# Patient Record
Sex: Male | Born: 1960 | Race: White | Hispanic: No | Marital: Married | Smoking: Current some day smoker
Health system: Southern US, Community
[De-identification: ages and names within clinical notes are randomized; demographics above are authoritative.]

## PROBLEM LIST (undated history)

## (undated) DIAGNOSIS — N200 Calculus of kidney: Secondary | ICD-10-CM

## (undated) DIAGNOSIS — E119 Type 2 diabetes mellitus without complications: Secondary | ICD-10-CM

## (undated) DIAGNOSIS — E785 Hyperlipidemia, unspecified: Secondary | ICD-10-CM

## (undated) DIAGNOSIS — I10 Essential (primary) hypertension: Secondary | ICD-10-CM

## (undated) HISTORY — DX: Type 2 diabetes mellitus without complications: E11.9

## (undated) HISTORY — DX: Calculus of kidney: N20.0

## (undated) HISTORY — DX: Hyperlipidemia, unspecified: E78.5

## (undated) HISTORY — PX: KIDNEY STONE SURGERY: SHX686

## (undated) HISTORY — DX: Essential (primary) hypertension: I10

---

## 1987-01-16 HISTORY — PX: PILONIDAL CYST EXCISION: SHX744

## 2009-06-06 ENCOUNTER — Ambulatory Visit: Payer: Self-pay | Admitting: General Surgery

## 2009-06-16 ENCOUNTER — Ambulatory Visit: Payer: Self-pay | Admitting: General Surgery

## 2010-01-15 HISTORY — PX: HERNIA REPAIR: SHX51

## 2010-02-21 ENCOUNTER — Ambulatory Visit: Payer: Self-pay

## 2013-01-15 HISTORY — PX: COLONOSCOPY: SHX174

## 2013-06-01 ENCOUNTER — Ambulatory Visit: Payer: Self-pay | Admitting: Gastroenterology

## 2013-06-02 LAB — PATHOLOGY REPORT

## 2013-06-16 DIAGNOSIS — N200 Calculus of kidney: Secondary | ICD-10-CM | POA: Insufficient documentation

## 2013-06-16 DIAGNOSIS — M109 Gout, unspecified: Secondary | ICD-10-CM | POA: Insufficient documentation

## 2013-06-16 DIAGNOSIS — E119 Type 2 diabetes mellitus without complications: Secondary | ICD-10-CM | POA: Insufficient documentation

## 2013-06-16 DIAGNOSIS — I10 Essential (primary) hypertension: Secondary | ICD-10-CM | POA: Insufficient documentation

## 2013-06-16 DIAGNOSIS — M1990 Unspecified osteoarthritis, unspecified site: Secondary | ICD-10-CM | POA: Insufficient documentation

## 2013-12-14 DIAGNOSIS — R1032 Left lower quadrant pain: Secondary | ICD-10-CM | POA: Insufficient documentation

## 2013-12-25 ENCOUNTER — Ambulatory Visit: Payer: Self-pay | Admitting: Family Medicine

## 2014-01-12 ENCOUNTER — Encounter: Payer: Self-pay | Admitting: *Deleted

## 2014-01-27 ENCOUNTER — Ambulatory Visit (INDEPENDENT_AMBULATORY_CARE_PROVIDER_SITE_OTHER): Payer: 59 | Admitting: General Surgery

## 2014-01-27 ENCOUNTER — Encounter: Payer: Self-pay | Admitting: General Surgery

## 2014-01-27 VITALS — BP 150/70 | HR 76 | Resp 14 | Ht 70.0 in | Wt 227.0 lb

## 2014-01-27 DIAGNOSIS — R1032 Left lower quadrant pain: Secondary | ICD-10-CM

## 2014-01-27 DIAGNOSIS — R1030 Lower abdominal pain, unspecified: Secondary | ICD-10-CM

## 2014-01-27 NOTE — Progress Notes (Signed)
Patient ID: Austin AsaLloyd Glenn Pepin Jr., male   DOB: Jul 16, 1960, 54 y.o.   MRN: [id number]  Chief Complaint  Patient presents with  . Other    left inguinal hernia    HPI Austin AsaLloyd Glenn Broadnax Jr. is a 54 y.o. male here today for a evaluation of a left inguinal pain . Patient had left ing hernia repair in 2012. He had been doing well till few weeks ago. He had a ct scan on 01/22/14.  He states he has been hurting for seven weeks now. HPI  Past Medical History  Diagnosis Date  . Hyperlipidemia   . Diabetes mellitus without complication   . Hypertension   . Kidney stone     Past Surgical History  Procedure Laterality Date  . Pilonidal cyst excision  1989  . Hernia repair Left 2012    inguinal hernia  . Colonoscopy  2015    Dr . Bluford Kaufmannh    History reviewed. No pertinent family history.  Social History History  Substance Use Topics  . Smoking status: Current Every Day Smoker -- 1.00 packs/day for 3 years    Types: Cigarettes  . Smokeless tobacco: Never Used  . Alcohol Use: 0.0 oz/week    0 Not specified per week    No Known Allergies  Current Outpatient Prescriptions  Medication Sig Dispense Refill  . amLODipine (NORVASC) 5 MG tablet Take 5 mg by mouth daily.     . metFORMIN (GLUCOPHAGE) 500 MG tablet 500 mg daily with breakfast.     . simvastatin (ZOCOR) 20 MG tablet Take 20 mg by mouth daily at 6 PM.     . tamsulosin (FLOMAX) 0.4 MG CAPS capsule 0.4 mg daily.     . traMADol (ULTRAM) 50 MG tablet Take 50 mg by mouth every 12 (twelve) hours as needed.      No current facility-administered medications for this visit.    Review of Systems Review of Systems  Constitutional: Negative.   Respiratory: Negative.   Cardiovascular: Negative.     Blood pressure 150/70, pulse 76, resp. rate 14, height 5\' 10"  (1.778 m), weight 227 lb (102.967 kg).  Physical Exam Physical Exam  Constitutional: He is oriented to person, place, and time. He appears well-developed and well-nourished.   Eyes: Conjunctivae are normal. No scleral icterus.  Neck: Neck supple.  Cardiovascular: Normal rate, regular rhythm and normal heart sounds.   Pulmonary/Chest: Effort normal and breath sounds normal.  Abdominal: Soft. Normal appearance and bowel sounds are normal. There is no hepatomegaly. There is no tenderness. No hernia.  Lymphadenopathy:    He has no cervical adenopathy.    He has no axillary adenopathy.  Neurological: He is alert and oriented to person, place, and time.  Skin: Skin is warm and dry.  Careful exam of left inguinal region showed no hernia.  Data Reviewed Ct scan reviewed- no hernia or other abnormality noted in left inguinal area. A very small right ing hernia is reported but it is not noted on exam.  Assessment    No left inguinal hernia felt on today visit. No other findings in inguinal area to account for the pain. He has a large left renal stone for which he is going to have surgery.     Plan   Pt advised fully. Patient to return as needed.       Temple Ewart G 01/28/2014, 6:34 AM

## 2014-01-27 NOTE — Patient Instructions (Addendum)
Patient to return as needed. 

## 2014-01-28 ENCOUNTER — Encounter: Payer: Self-pay | Admitting: General Surgery

## 2015-07-17 ENCOUNTER — Emergency Department
Admission: EM | Admit: 2015-07-17 | Discharge: 2015-07-17 | Disposition: A | Discharge status: Home / Self Care | Payer: 59 | Attending: Emergency Medicine | Admitting: Emergency Medicine

## 2015-07-17 DIAGNOSIS — Y999 Unspecified external cause status: Secondary | ICD-10-CM | POA: Insufficient documentation

## 2015-07-17 DIAGNOSIS — E119 Type 2 diabetes mellitus without complications: Secondary | ICD-10-CM | POA: Diagnosis not present

## 2015-07-17 DIAGNOSIS — T161XXA Foreign body in right ear, initial encounter: Secondary | ICD-10-CM | POA: Diagnosis present

## 2015-07-17 DIAGNOSIS — X58XXXA Exposure to other specified factors, initial encounter: Secondary | ICD-10-CM | POA: Insufficient documentation

## 2015-07-17 DIAGNOSIS — Y939 Activity, unspecified: Secondary | ICD-10-CM | POA: Diagnosis not present

## 2015-07-17 DIAGNOSIS — E785 Hyperlipidemia, unspecified: Secondary | ICD-10-CM | POA: Diagnosis not present

## 2015-07-17 DIAGNOSIS — Z7984 Long term (current) use of oral hypoglycemic drugs: Secondary | ICD-10-CM | POA: Insufficient documentation

## 2015-07-17 DIAGNOSIS — Y929 Unspecified place or not applicable: Secondary | ICD-10-CM | POA: Diagnosis not present

## 2015-07-17 DIAGNOSIS — I10 Essential (primary) hypertension: Secondary | ICD-10-CM | POA: Diagnosis not present

## 2015-07-17 DIAGNOSIS — F17210 Nicotine dependence, cigarettes, uncomplicated: Secondary | ICD-10-CM | POA: Insufficient documentation

## 2015-07-17 NOTE — ED Provider Notes (Signed)
[facility] Emergency Department Provider Note  ____________________________________________  Time seen: On arrival  I have reviewed the triage vital signs and the nursing notes.   HISTORY  Chief Complaint Foreign Body in Ear    HPI Archer AsaLloyd Glenn Lucena Jr. is a 55 y.o. male who presents with complaints of an insect in his right ear. He reports a moth flew into his right ear while he was outside. He put mineral oil inside and tried use a shop vacuum unsuccessfully.    Past Medical History  Diagnosis Date  . Hyperlipidemia   . Diabetes mellitus without complication   . Hypertension   . Kidney stone     There are no active problems to display for this patient.   Past Surgical History  Procedure Laterality Date  . Pilonidal cyst excision  1989  . Hernia repair Left 2012    inguinal hernia  . Colonoscopy  2015    Dr . Bluford Kaufmannh    Current Outpatient Rx  Name  Route  Sig  Dispense  Refill  . amLODipine (NORVASC) 5 MG tablet   Oral   Take 5 mg by mouth daily.          . metFORMIN (GLUCOPHAGE) 500 MG tablet      500 mg daily with breakfast.          . simvastatin (ZOCOR) 20 MG tablet   Oral   Take 20 mg by mouth daily at 6 PM.          . tamsulosin (FLOMAX) 0.4 MG CAPS capsule      0.4 mg daily.          . traMADol (ULTRAM) 50 MG tablet   Oral   Take 50 mg by mouth every 12 (twelve) hours as needed.            Allergies Review of patient's allergies indicates no known allergies.  No family history on file.  Social History Social History  Substance Use Topics  . Smoking status: Current Every Day Smoker -- 1.00 packs/day for 3 years    Types: Cigarettes  . Smokeless tobacco: Never Used  . Alcohol Use: 0.0 oz/week    0 Standard drinks or equivalent per week    Review of Systems  Constitutional: No dizziness  ENT: Positive for right ear pain    Psychological: Positive for  anxiety   ____________________________________________   PHYSICAL EXAM:  VITAL SIGNS: ED Triage Vitals  Enc Vitals Group     BP 07/17/15 2205 155/86 mmHg     Pulse Rate 07/17/15 2205 78     Resp 07/17/15 2205 18     Temp 07/17/15 2205 98.8 F (37.1 C)     Temp Source 07/17/15 2205 Oral     SpO2 07/17/15 2205 100 %     Weight 07/17/15 2205 235 lb (106.595 kg)     Height 07/17/15 2205 5\' 11"  (1.803 m)     Head Cir --      Peak Flow --      Pain Score 07/17/15 2205 5     Pain Loc --      Pain Edu? --      Excl. in GC? --      Constitutional: Alert and oriented. Well appearing But anxious Eyes: Conjunctivae are normal.  ENT   Head: Normocephalic and atraumatic.   Mouth/Throat: Mucous membranes are moist. Ears: Insect noted inside right ear canal Cardiovascular: Normal rate, regular rhythm.  Respiratory: Normal  respiratory effort without tachypnea nor retractions.  Gastrointestinal: Soft and non-tender in all quadrants. No distention. There is no CVA tenderness. Musculoskeletal: Nontender with normal range of motion in all extremities. Neurologic:  Normal speech and language. No gross focal neurologic deficits are appreciated. Skin:  Skin is warm, dry and intact. No rash noted. Psychiatric: Mood and affect are normal. Patient exhibits appropriate insight and judgment.  ____________________________________________    LABS (pertinent positives/negatives)  Labs Reviewed - No data to display  ____________________________________________     ____________________________________________    RADIOLOGY I have personally reviewed any xrays that were ordered on this patient: None  ____________________________________________   PROCEDURES  Procedure(s) performed: yes FB removal. Lidocaine instilled into Right ear canal  Frazier suction used to remove moth. Confirmed complete removal of insect TM appears intact Patient feels much better, reports hearing is  normal   ____________________________________________   INITIAL IMPRESSION / ASSESSMENT AND PLAN / ED COURSE  Pertinent labs & imaging results that were available during my care of the patient were reviewed by me and considered in my medical decision making (see chart for details).  Moth in right ear, removed with Frazier suction, patient tolerated well  ____________________________________________   FINAL CLINICAL IMPRESSION(S) / ED DIAGNOSES  Final diagnoses:  Foreign body in ear, right, initial encounter     Jene Everyobert Tyerra Loretto, MD 07/17/15 2241

## 2015-07-17 NOTE — ED Notes (Signed)
Pt states has a moth in right ear. Pt with pwd skin.

## 2015-07-17 NOTE — ED Notes (Signed)
md in to remove fb. Pt states "hurry, this thing is driving me nuts and turn that beeping off, it's bothering me." in relation to another pt's call bell.

## 2015-07-17 NOTE — Discharge Instructions (Signed)
Ear Foreign Body °An ear foreign body is an object that is stuck in your ear. The object is usually stuck in the ear canal. °CAUSES °In all ages of people, the most common foreign bodies are insects that enter the ear canal. It is common for young children to put objects into the ear canal. These may include pebbles, beads, parts of toys, and any other small objects that fit into the ear. In adults, objects such as cotton swabs may become lodged in the ear canal.  °SIGNS AND SYMPTOMS °A foreign body in the ear may cause: °· Pain. °· Buzzing or roaring sounds. °· Hearing loss. °· Ear drainage or bleeding. °· Nausea and vomiting. °· A feeling that your ear is full. °DIAGNOSIS °Your health care provider may be able to diagnose an ear foreign body based on the information that you provide, your symptoms, and a physical exam. Your health care provider may also perform tests, such as testing your hearing and your ear pressure, to check for infection or other problems that are caused by the foreign body in your ear. °TREATMENT °Treatment depends on what the foreign body is, the location of the foreign body in your ear, and whether or not the foreign body has injured any part of your inner ear. If the foreign body is visible to your health care provider, it may be possible to remove the foreign body using: °· A tool, such as medical tweezers (forceps) or a suction tube (catheter). °· Irrigation. This uses water to flush the foreign body out of your ear. This is used only if the foreign body is not likely to swell or enlarge when it is put in water. °If the foreign body is not visible or your health care provider was not able to remove the foreign body, you may be referred to a specialist for removal. You may also be prescribed antibiotic medicine or ear drops to prevent infection. If the foreign body has caused injury to other parts of your ear, you may need additional treatment. °HOME CARE INSTRUCTIONS °· Keep all  follow-up visits as directed by your health care provider. This is important. °· Take medicines only as directed by your health care provider. °· If you were prescribed an antibiotic medicine, finish it all even if you start to feel better. °PREVENTION °· Keep small objects out of reach of young children. Tell children not to put anything in their ears. °· Do not put anything in your ear, including cotton swabs, to clean your ears. Talk to your health care provider about how to clean your ears safely. °SEEK MEDICAL CARE IF: °· You have a headache. °· Your have blood coming from your ear. °· You have a fever. °· You have increased pain or swelling of your ear. °· Your hearing is reduced. °· You have discharge coming from your ear. °  °This information is not intended to replace advice given to you by your health care provider. Make sure you discuss any questions you have with your health care provider. °  °Document Released: 12/30/1999 Document Revised: 01/22/2014 Document Reviewed: 08/17/2013 °Elsevier Interactive Patient Education ©2016 [employer] ° °

## 2015-12-14 IMAGING — CT CT ABD-PELV W/ CM
2 of 5 series · 16 of 46 positions shown, 18 images · IV contrast (omnipaque)
Comparison: None.

CLINICAL DATA: 53-year-old with complaints of left-sided groin pain
radiating to left hip an anterior left abdomen for 3 weeks.

EXAM:
CT ABDOMEN AND PELVIS WITH CONTRAST
TECHNIQUE: Multidetector CT imaging of the abdomen and pelvis was performed
using the standard protocol following bolus administration of
intravenous contrast.
CONTRAST:  100 mL Omnipaque 350 was administered intravenously

[Series 2: routine with · axial · 0.85mm/px · z∈[-939,-539]mm · 13 of 90 slices shown, 15 images]
[im 5/90  soft-tissue]
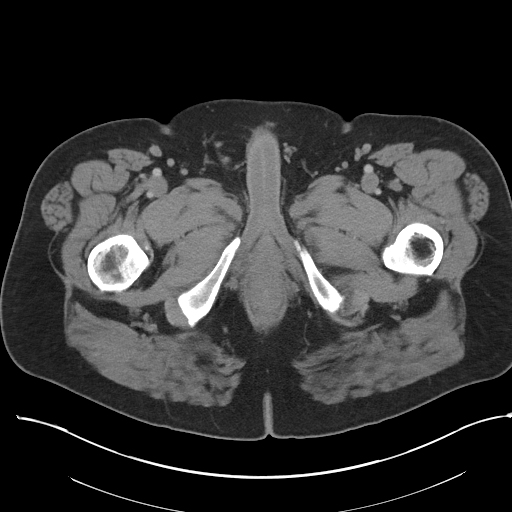
[im 5/90  bone]
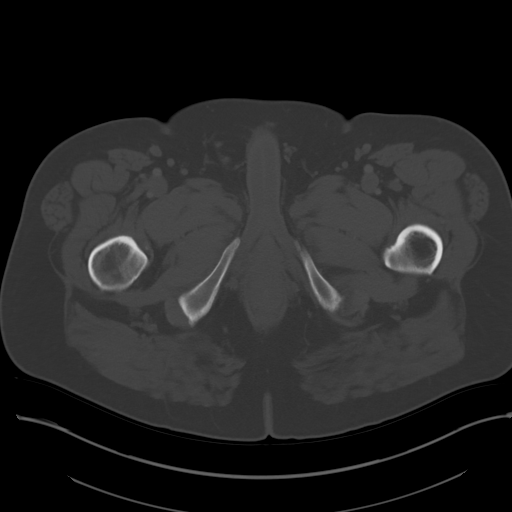
[im 15/90  soft-tissue]
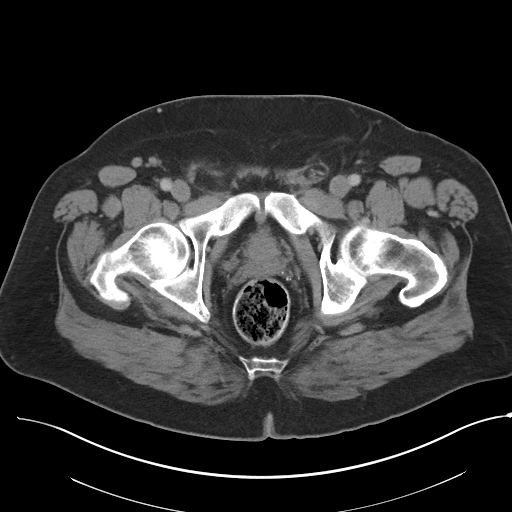
[im 19/90  soft-tissue]
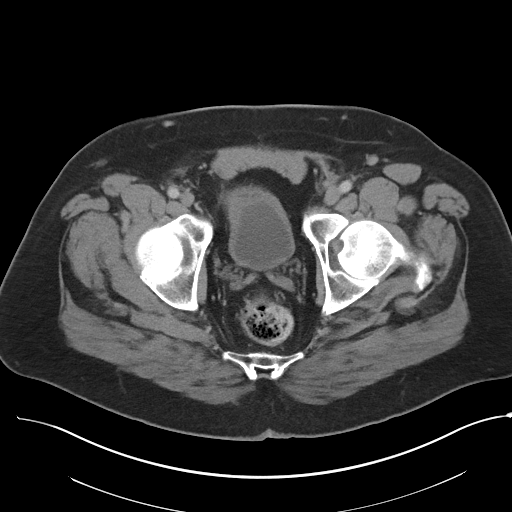
[im 24/90  soft-tissue]
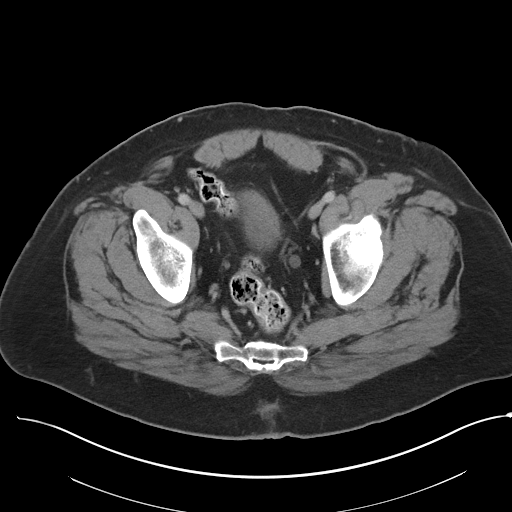
[im 33/90  soft-tissue]
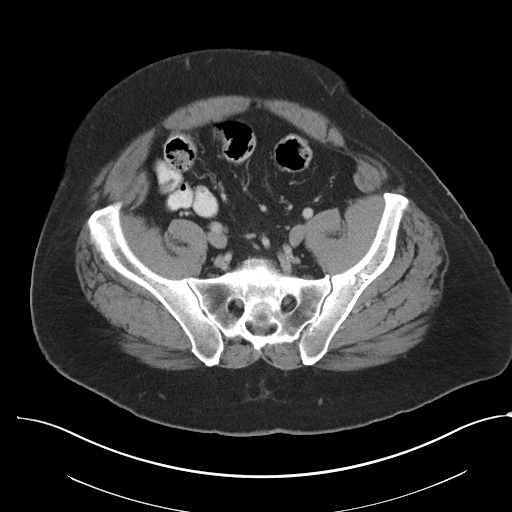
[im 38/90  soft-tissue]
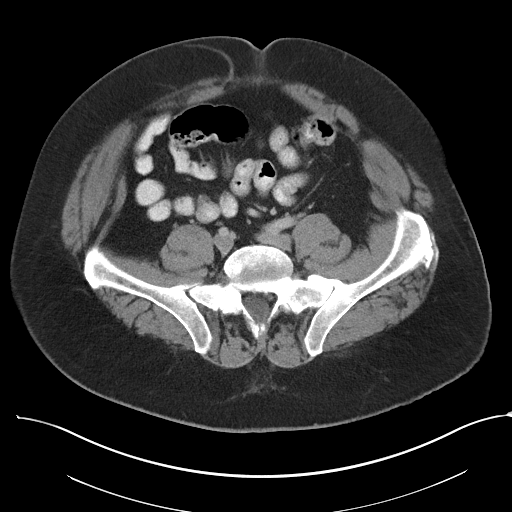
[im 47/90  soft-tissue]
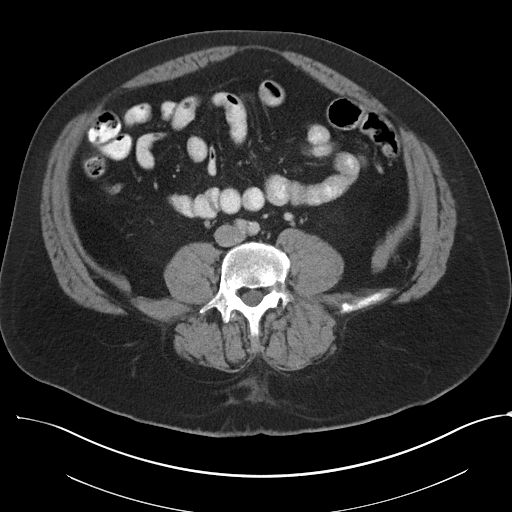
[im 52/90  soft-tissue]
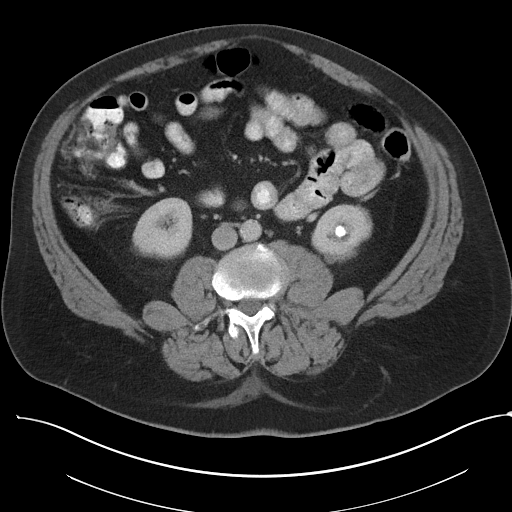
[im 57/90  soft-tissue]
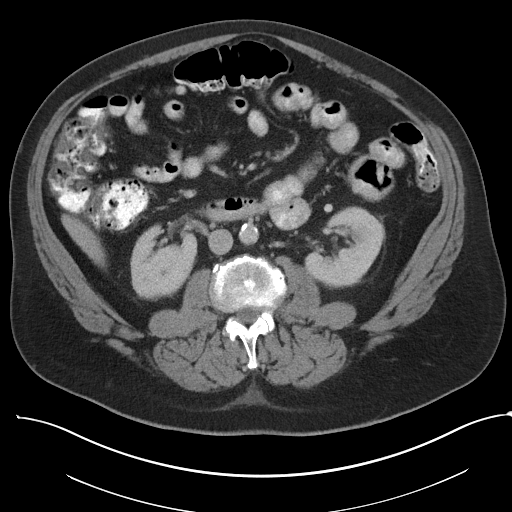
[im 57/90  bone]
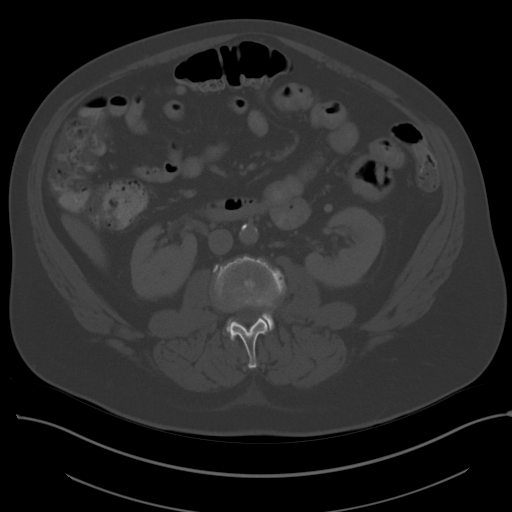
[im 66/90  soft-tissue]
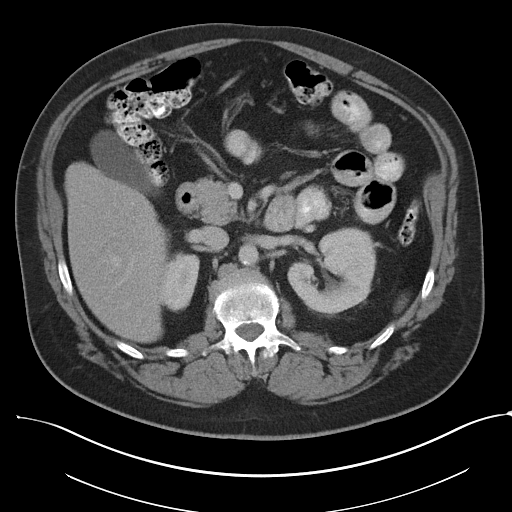
[im 71/90  soft-tissue]
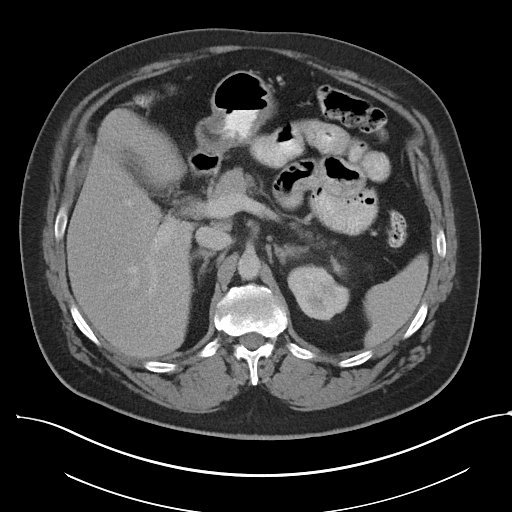
[im 75/90  soft-tissue]
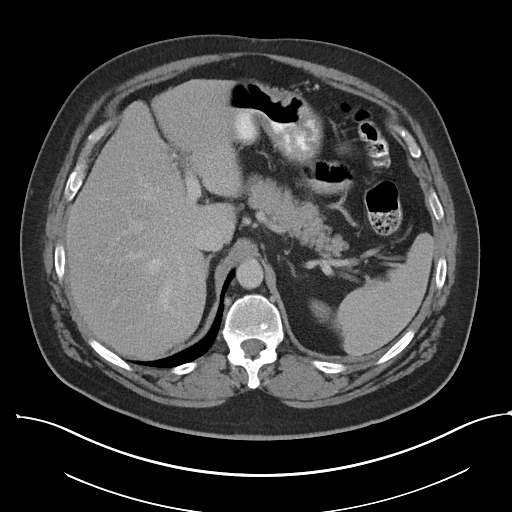
[im 85/90  soft-tissue]
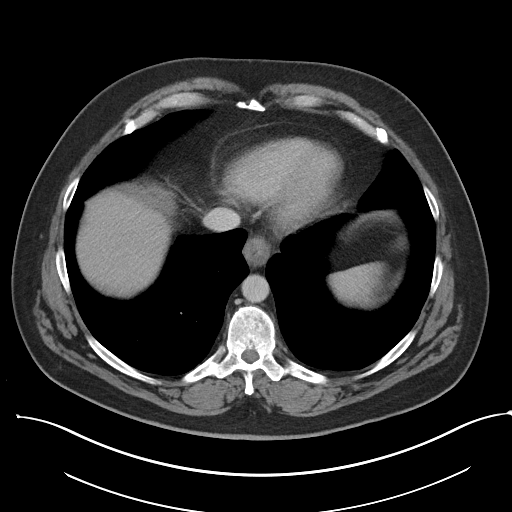

[Series 6: cor routine with · coronal · 0.89mm/px · 3 of 188 slices shown]
[im 63/188  soft-tissue]
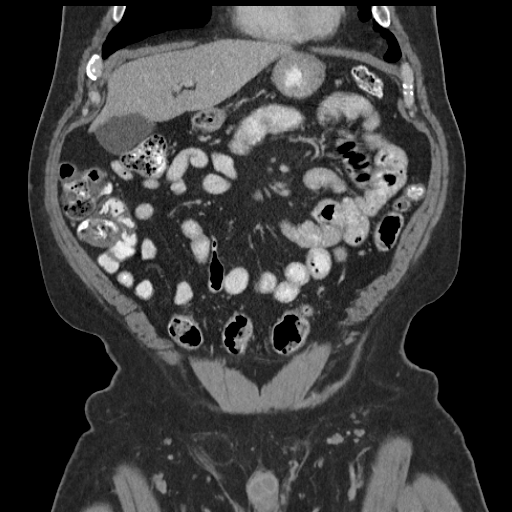
[im 84/188  soft-tissue]
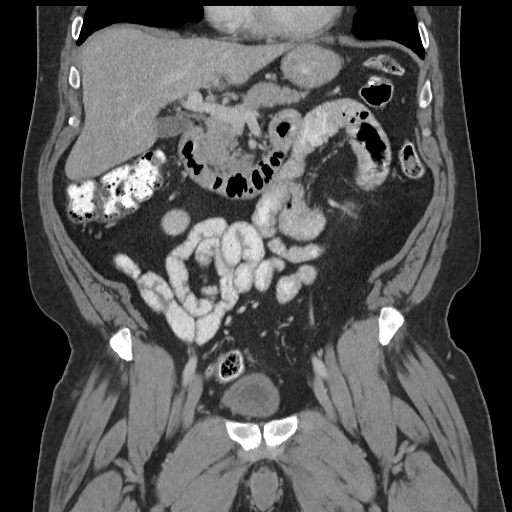
[im 104/188  soft-tissue]
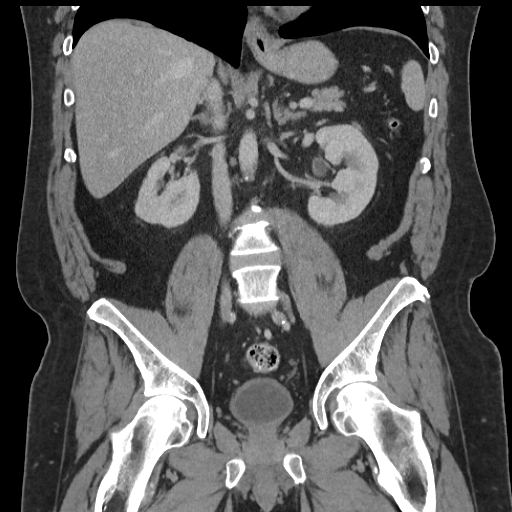

[16 of 46 positions shown; findings below may reference images not displayed]

FINDINGS: Chest:The partially visualized lung bases are unremarkable. The
visualized heart is unremarkable.

Liver: A subcentimeter hypodensity is noted in the right hepatic
lobe, too small to fully characterize. The liver is otherwise
unremarkable.

Gallbladder: There is no cholelithiasis, gallbladder wall thickening
or pericholecystic fluid.

Spleen: Unremarkable.

Pancreas: Unremarkable.

Adrenal glands: Unremarkable.

Kidneys: A 1.7 cm nonobstructing stone is noted in the lower pole of
the left kidney. Additional adjacent smaller stones are present
measuring 2 mm and 5 mm. There is no hydronephrosis. The right
kidney is unremarkable.

Bowel/gastrointestinal tract: There is no evidence for bowel
obstruction. No abnormal bowel wall thickening is identified. The
appendix is normal.

Pelvis: There is asymmetric heterogeneous bladder wall thickening,
predominantly involving the anterior aspect of the urinary bladder.
Inflammatory soft tissue stranding is noted about the anterior
aspect of the urinary bladder.

A small fat containing right inguinal hernia is noted.

Miscellaneous: Scattered prostatic calcifications are present. The
prostate size is within normal range.

Osseous structures: Multilevel degenerative changes of the spine are
present. Degenerative changes of the hips are also noted.
IMPRESSION: 1. Nonobstructing left renal calculi.
2. Small fat containing right inguinal hernia.
3. Asymmetric bladder wall thickening with inflammatory changes is
most suspicious for cystitis. Clinical correlation is recommended.
Alternatively, asymmetric bladder wall thickening can be associated
with malignancy. Follow-up is recommended.

## 2017-09-24 DIAGNOSIS — M1A0790 Idiopathic chronic gout, unspecified ankle and foot, without tophus (tophi): Secondary | ICD-10-CM | POA: Insufficient documentation

## 2017-09-24 DIAGNOSIS — Z91030 Bee allergy status: Secondary | ICD-10-CM | POA: Insufficient documentation

## 2019-03-23 ENCOUNTER — Ambulatory Visit: Payer: Self-pay | Attending: Internal Medicine

## 2019-03-23 DIAGNOSIS — Z23 Encounter for immunization: Secondary | ICD-10-CM

## 2019-03-23 NOTE — Progress Notes (Signed)
   [id number]  Name:  Ziare Cryder.    MRN: [id number] DOB: 10-Mar-1960  03/23/2019  Mr. Oyama was observed post Covid-19 immunization for 15 minutes without incident. He was provided with Vaccine Information Sheet and instruction to access the V-Safe system.   Mr. Viglione was instructed to call 911 with any severe reactions post vaccine: Marland Kitchen Difficulty breathing  . Swelling of face and throat  . A fast heartbeat  . A bad rash all over body  . Dizziness and weakness   Immunizations Administered    Name Date Dose VIS Date Route   Pfizer COVID-19 Vaccine 03/23/2019  8:21 AM 0.3 mL 12/26/2018 Intramuscular   Manufacturer: [employer], [employer]   Lot: [id number]   NDC: [address]-5

## 2019-04-14 ENCOUNTER — Ambulatory Visit: Payer: Self-pay | Attending: Internal Medicine

## 2019-04-14 DIAGNOSIS — Z23 Encounter for immunization: Secondary | ICD-10-CM

## 2019-04-14 NOTE — Progress Notes (Signed)
   [id number]  Name:  Treysen Sudbeck.    MRN: [id number] DOB: [birth year]  04/14/2019  Mr. Ambrosius was observed post Covid-19 immunization for 15 minutes without incident. He was provided with Vaccine Information Sheet and instruction to access the V-Safe system.   Mr. Bless was instructed to call 911 with any severe reactions post vaccine: Marland Kitchen Difficulty breathing  . Swelling of face and throat  . A fast heartbeat  . A bad rash all over body  . Dizziness and weakness   Immunizations Administered    Name Date Dose VIS Date Route   Pfizer COVID-19 Vaccine 04/14/2019  8:09 AM 0.3 mL 12/26/2018 Intramuscular   Manufacturer: [employer], [employer]   Lot: [id number]   NDC: [address]-1

## 2019-08-20 ENCOUNTER — Other Ambulatory Visit: Payer: Self-pay

## 2019-08-20 ENCOUNTER — Ambulatory Visit
Admission: RE | Admit: 2019-08-20 | Discharge: 2019-08-20 | Disposition: A | Discharge status: Home / Self Care | Payer: Managed Care, Other (non HMO) | Attending: Emergency Medicine | Admitting: Emergency Medicine

## 2019-08-20 ENCOUNTER — Ambulatory Visit
Admission: EM | Admit: 2019-08-20 | Discharge: 2019-08-20 | Disposition: A | Discharge status: Home / Self Care | Payer: Self-pay | Attending: Emergency Medicine | Admitting: Emergency Medicine

## 2019-08-20 ENCOUNTER — Ambulatory Visit
Admission: RE | Admit: 2019-08-20 | Discharge: 2019-08-20 | Disposition: A | Discharge status: Home / Self Care | Payer: Managed Care, Other (non HMO) | Source: Ambulatory Visit | Attending: Emergency Medicine | Admitting: Emergency Medicine

## 2019-08-20 DIAGNOSIS — J22 Unspecified acute lower respiratory infection: Secondary | ICD-10-CM

## 2019-08-20 DIAGNOSIS — R05 Cough: Secondary | ICD-10-CM | POA: Insufficient documentation

## 2019-08-20 MED ORDER — AZITHROMYCIN 250 MG PO TABS
250.0000 mg | ORAL_TABLET | Freq: Every day | ORAL | 0 refills | Status: DC
Start: 2019-08-20 — End: 2020-03-14

## 2019-08-20 MED ORDER — BENZONATATE 100 MG PO CAPS: 100.0000 mg | ORAL_CAPSULE | Freq: Three times a day (TID) | ORAL | 0 refills | Status: AC | PRN

## 2019-08-20 NOTE — ED Triage Notes (Signed)
Patient reports nasal congestion, chest congestion, cough, and headaches x12 days. Patient reports most of his symptoms have resolved but reports he is still having episodes of coughing that cause headaches. States it started as a productive cough but has turned into a dry cough.   OTC: dayquil, mucinex  Denies: fevers, body aches, chills.

## 2019-08-20 NOTE — Discharge Instructions (Addendum)
Go to [facility] for your chest xray.  I will call you this morning with the results.    Take the Zithromax as directed.    Your Covid test is pending.  You should self quarantine until the test result is back.    Call your primary care provider to schedule an appointment for a recheck.

## 2019-08-20 NOTE — ED Provider Notes (Signed)
Austin Perry    CSN: [id number] Arrival date & time: 08/20/19  0803      History   Chief Complaint Chief Complaint  Patient presents with  . URI    HPI Austin Perry. is a 59 y.o. male.   Patient presents with 12-day history of cough productive of yellow phlegm, chest congestion, nasal congestion, sinus headache, runny nose.  Treatment attempted at home with DayQuil and Mucinex.  He denies fever, chills, body aches, shortness of breath, abdominal pain, or other symptoms.  He is a smoker.   The history is provided by the patient.    Past Medical History:  Diagnosis Date  . Diabetes mellitus without complication (HCC)   . Hyperlipidemia   . Hypertension   . Kidney stone     There are no problems to display for this patient.   Past Surgical History:  Procedure Laterality Date  . COLONOSCOPY  2015   Dr . Bluford Kaufmann  . HERNIA REPAIR Left 2012   inguinal hernia  . KIDNEY STONE SURGERY    . PILONIDAL CYST EXCISION  1989       Home Medications    Prior to Admission medications   Medication Sig Start Date End Date Taking? Authorizing Provider  amLODipine (NORVASC) 5 MG tablet Take 5 mg by mouth daily.  11/02/13   [provider]  azithromycin (ZITHROMAX) 250 MG tablet Take 1 tablet (250 mg total) by mouth daily. Take first 2 tablets together, then 1 every day until finished. 08/20/19   Mickie Bail, NP  benzonatate (TESSALON) 100 MG capsule Take 1 capsule (100 mg total) by mouth 3 (three) times daily as needed for cough. 08/20/19   Mickie Bail, NP  metFORMIN (GLUCOPHAGE) 500 MG tablet 500 mg daily with breakfast.  11/02/13   [provider]  simvastatin (ZOCOR) 20 MG tablet Take 20 mg by mouth daily at 6 PM.  11/02/13   [provider]  tamsulosin (FLOMAX) 0.4 MG CAPS capsule 0.4 mg daily.  01/12/14   [provider]  traMADol (ULTRAM) 50 MG tablet Take 50 mg by mouth every 12 (twelve) hours as needed.  01/20/14   [provider]    Family History History reviewed. No pertinent family history.  Social History Social History   Tobacco Use  . Smoking status: Current Every Day Smoker    Packs/day: 1.00    Years: 3.00    Pack years: 3.00    Types: Cigarettes  . Smokeless tobacco: Never Used  Substance Use Topics  . Alcohol use: Yes    Alcohol/week: 0.0 standard drinks    Comment: occ  . Drug use: No     Allergies   Patient has no known allergies.   Review of Systems Review of Systems  Constitutional: Negative for chills and fever.  HENT: Positive for congestion, postnasal drip, rhinorrhea and sinus pressure. Negative for ear pain and sore throat.   Eyes: Negative for pain and visual disturbance.  Respiratory: Positive for cough. Negative for shortness of breath.   Cardiovascular: Negative for chest pain and palpitations.  Gastrointestinal: Negative for abdominal pain, diarrhea, nausea and vomiting.  Genitourinary: Negative for dysuria and hematuria.  Musculoskeletal: Negative for arthralgias and back pain.  Skin: Negative for color change and rash.  Neurological: Positive for headaches. Negative for dizziness, seizures, syncope, weakness and numbness.  All other systems reviewed and are negative.    Physical Exam Triage Vital Signs ED Triage Vitals  Enc Vitals Group     BP      Pulse      Resp      Temp      Temp src      SpO2      Weight      Height      Head Circumference      Peak Flow      Pain Score      Pain Loc      Pain Edu?      Excl. in GC?    No data found.  Updated Vital Signs BP (!) 144/87   Pulse 86   Temp 98.3 F (36.8 C)   Resp 20   SpO2 93%   Visual Acuity Right Eye Distance:   Left Eye Distance:   Bilateral Distance:    Right Eye Near:   Left Eye Near:    Bilateral Near:     Physical Exam Vitals and nursing note reviewed.  Constitutional:      General: He is not in acute distress.    Appearance: He is well-developed.  HENT:      Head: Normocephalic and atraumatic.     Right Ear: Tympanic membrane normal.     Left Ear: Tympanic membrane normal.     Nose: Congestion and rhinorrhea present.     Mouth/Throat:     Mouth: Mucous membranes are moist.     Pharynx: Oropharynx is clear.  Eyes:     Conjunctiva/sclera: Conjunctivae normal.  Cardiovascular:     Rate and Rhythm: Normal rate and regular rhythm.     Heart sounds: No murmur heard.   Pulmonary:     Effort: Pulmonary effort is normal. No respiratory distress.     Breath sounds: Examination of the left-lower field reveals rhonchi. Rhonchi present.  Abdominal:     Palpations: Abdomen is soft.     Tenderness: There is no abdominal tenderness. There is no guarding or rebound.  Musculoskeletal:     Cervical back: Neck supple.  Skin:    General: Skin is warm and dry.     Findings: No rash.  Neurological:     General: No focal deficit present.     Mental Status: He is alert and oriented to person, place, and time.     Gait: Gait normal.  Psychiatric:        Mood and Affect: Mood normal.        Behavior: Behavior normal.      UC Treatments / Results  Labs (all labs ordered are listed, but only abnormal results are displayed) Labs Reviewed  NOVEL CORONAVIRUS, NAA    EKG   Radiology DG Chest 2 View  Result Date: 08/20/2019 CLINICAL DATA:  Productive cough EXAM: CHEST - 2 VIEW COMPARISON:  02/21/2010 FINDINGS: Mild peribronchial thickening, stable. No confluent opacities or effusions. No acute bony abnormality. IMPRESSION: Mild chronic bronchitic changes. Electronically Signed   By: Charlett Nose M.D.   On: 08/20/2019 09:05    Procedures Procedures (including critical care time)  Medications Ordered in UC Medications - No data to display  Initial Impression / Assessment and Plan / UC Course  I have reviewed the triage vital signs and the nursing notes.  Pertinent labs & imaging results that were available during my care of the patient were  reviewed by me and considered in my medical decision making (see chart for details).   Lower respiratory infection. CXR shows chronic bronchitic changes.  Treating with Zithromax  and [employer].  PCR COVID pending.  Instructed patient to self quarantine until his test results are back.  Instructed him to follow-up with his PCP if his symptoms or not improving.  Patient agrees to plan of care.      Final Clinical Impressions(s) / UC Diagnoses   Final diagnoses:  Lower respiratory infection     Discharge Instructions     Go to [facility] for your chest xray.  I will call you this morning with the results.    Take the Zithromax as directed.    Your Covid test is pending.  You should self quarantine until the test result is back.    Call your primary care provider to schedule an appointment for a recheck.        ED Prescriptions    Medication Sig Dispense Auth. Provider   azithromycin (ZITHROMAX) 250 MG tablet Take 1 tablet (250 mg total) by mouth daily. Take first 2 tablets together, then 1 every day until finished. 6 tablet Mickie Bail, NP   benzonatate (TESSALON) 100 MG capsule Take 1 capsule (100 mg total) by mouth 3 (three) times daily as needed for cough. 21 capsule Mickie Bail, NP     PDMP not reviewed this encounter.   Mickie Bail, NP 08/20/19 [phone]

## 2019-08-21 LAB — SARS-COV-2, NAA 2 DAY TAT

## 2019-08-21 LAB — NOVEL CORONAVIRUS, NAA: SARS-CoV-2, NAA: NOT DETECTED

## 2019-09-11 DIAGNOSIS — Z9189 Other specified personal risk factors, not elsewhere classified: Secondary | ICD-10-CM | POA: Insufficient documentation

## 2019-09-11 DIAGNOSIS — Z720 Tobacco use: Secondary | ICD-10-CM | POA: Insufficient documentation

## 2019-09-11 DIAGNOSIS — R0683 Snoring: Secondary | ICD-10-CM | POA: Insufficient documentation

## 2019-09-11 DIAGNOSIS — Z6836 Body mass index (BMI) 36.0-36.9, adult: Secondary | ICD-10-CM | POA: Insufficient documentation

## 2019-09-11 DIAGNOSIS — G4719 Other hypersomnia: Secondary | ICD-10-CM | POA: Insufficient documentation

## 2019-09-11 DIAGNOSIS — R0681 Apnea, not elsewhere classified: Secondary | ICD-10-CM | POA: Insufficient documentation

## 2019-09-11 DIAGNOSIS — Z789 Other specified health status: Secondary | ICD-10-CM | POA: Insufficient documentation

## 2020-03-14 ENCOUNTER — Other Ambulatory Visit: Payer: Self-pay

## 2020-03-14 ENCOUNTER — Ambulatory Visit
Admission: EM | Admit: 2020-03-14 | Discharge: 2020-03-14 | Disposition: A | Discharge status: Home / Self Care | Payer: Managed Care, Other (non HMO)

## 2020-03-14 DIAGNOSIS — Z20822 Contact with and (suspected) exposure to covid-19: Secondary | ICD-10-CM | POA: Diagnosis not present

## 2020-03-14 DIAGNOSIS — J0110 Acute frontal sinusitis, unspecified: Secondary | ICD-10-CM

## 2020-03-14 MED ORDER — AMOXICILLIN-POT CLAVULANATE 875-125 MG PO TABS
1.0000 | ORAL_TABLET | Freq: Two times a day (BID) | ORAL | 0 refills | Status: DC
Start: 2020-03-14 — End: 2021-05-14

## 2020-03-14 NOTE — ED Triage Notes (Signed)
Pt c/o congestion since Tuesday (approx 6 days), productive cough with green sputum onset Friday. Here b/c he feels he has a sinus infection.  Denies fever, n/v/d, SOB, CP, sore throat, ear pain.  Has been taking OTC Tussin, coricidin with some improvements. No tylenol/ibuprofen taken.

## 2020-03-14 NOTE — ED Provider Notes (Signed)
Austin Perry    CSN: [id number] Arrival date & time: 03/14/20  0803      History   Chief Complaint Chief Complaint  Patient presents with  . Nasal Congestion    HPI Austin Perry. is a 60 y.o. male.   Pt is a 60 year old male that presents today with nasal congestion since Tuesday (approx 6 days), productive cough with green sputum onset Friday. Here b/c he feels he has a sinus infection. Denies fever, n/v/d, SOB, CP, sore throat, ear pain.Has been taking OTC Tussin, coricidin with some improvements.      Past Medical History:  Diagnosis Date  . Diabetes mellitus without complication (HCC)   . Hyperlipidemia   . Hypertension   . Kidney stone     There are no problems to display for this patient.   Past Surgical History:  Procedure Laterality Date  . COLONOSCOPY  2015   Dr . Bluford Kaufmann  . HERNIA REPAIR Left 2012   inguinal hernia  . KIDNEY STONE SURGERY    . PILONIDAL CYST EXCISION  1989       Home Medications    Prior to Admission medications   Medication Sig Start Date End Date Taking? Authorizing Provider  amLODipine (NORVASC) 5 MG tablet Take 5 mg by mouth daily.  11/02/13  Yes [provider]  amoxicillin-clavulanate (AUGMENTIN) 875-125 MG tablet Take 1 tablet by mouth every 12 (twelve) hours. 03/14/20  Yes Cathy Ropp A, NP  atorvastatin (LIPITOR) 20 MG tablet Take by mouth. 09/23/19 09/22/20 Yes [provider]  metFORMIN (GLUCOPHAGE) 500 MG tablet 500 mg daily with breakfast.  11/02/13  Yes [provider]  simvastatin (ZOCOR) 20 MG tablet Take 20 mg by mouth daily at 6 PM.  11/02/13 03/14/20 Yes [provider]  allopurinol (ZYLOPRIM) 300 MG tablet Take 300 mg by mouth daily. 01/15/20   [provider]  benzonatate (TESSALON) 100 MG capsule Take 1 capsule (100 mg total) by mouth 3 (three) times daily as needed for cough. 08/20/19   Mickie Bail, NP  traMADol (ULTRAM) 50 MG tablet Take 50 mg by mouth  every 12 (twelve) hours as needed.  01/20/14   [provider]  triamcinolone (NASACORT) 55 MCG/ACT AERO nasal inhaler Place into the nose.    [provider]    Family History History reviewed. No pertinent family history.  Social History Social History   Tobacco Use  . Smoking status: Current Every Day Smoker    Packs/day: 1.00    Years: 3.00    Pack years: 3.00    Types: Cigarettes  . Smokeless tobacco: Never Used  Substance Use Topics  . Alcohol use: Yes    Alcohol/week: 0.0 standard drinks    Comment: occ  . Drug use: No     Allergies   Other and Meloxicam   Review of Systems Review of Systems   Physical Exam Triage Vital Signs ED Triage Vitals  Enc Vitals Group     BP 03/14/20 0816 132/84     Pulse Rate 03/14/20 0816 67     Resp 03/14/20 0816 18     Temp 03/14/20 0816 99.1 F (37.3 C)     Temp Source 03/14/20 0816 Oral     SpO2 03/14/20 0816 97 %     Weight --      Height --      Head Circumference --      Peak Flow --  Pain Score 03/14/20 0817 0     Pain Loc --      Pain Edu? --      Excl. in GC? --    No data found.  Updated Vital Signs BP 132/84 (BP Location: Left Arm)   Pulse 67   Temp 99.1 F (37.3 C) (Oral)   Resp 18   SpO2 97%   Visual Acuity Right Eye Distance:   Left Eye Distance:   Bilateral Distance:    Right Eye Near:   Left Eye Near:    Bilateral Near:     Physical Exam Vitals and nursing note reviewed.  Constitutional:      General: He is not in acute distress.    Appearance: Normal appearance. He is not ill-appearing, toxic-appearing or diaphoretic.  HENT:     Head: Normocephalic and atraumatic.     Right Ear: Tympanic membrane and ear canal normal.     Left Ear: Tympanic membrane and ear canal normal.     Nose: Congestion present.     Mouth/Throat:     Pharynx: Oropharynx is clear.  Eyes:     Conjunctiva/sclera: Conjunctivae normal.  Cardiovascular:     Rate and Rhythm: Normal rate and  regular rhythm.  Pulmonary:     Effort: Pulmonary effort is normal.     Breath sounds: Normal breath sounds.  Musculoskeletal:        General: Normal range of motion.     Cervical back: Normal range of motion.  Skin:    General: Skin is warm and dry.  Neurological:     Mental Status: He is alert.  Psychiatric:        Mood and Affect: Mood normal.      UC Treatments / Results  Labs (all labs ordered are listed, but only abnormal results are displayed) Labs Reviewed  NOVEL CORONAVIRUS, NAA    EKG   Radiology No results found.  Procedures Procedures (including critical care time)  Medications Ordered in UC Medications - No data to display  Initial Impression / Assessment and Plan / UC Course  I have reviewed the triage vital signs and the nursing notes.  Pertinent labs & imaging results that were available during my care of the patient were reviewed by me and considered in my medical decision making (see chart for details).     Sinusitis Treating for a sinus infection  abx as prescribed OTC meds as needed for symptoms.  Follow up as needed for continued or worsening symptoms   Final Clinical Impressions(s) / UC Diagnoses   Final diagnoses:  Encounter for laboratory testing for COVID-19 virus  Acute non-recurrent frontal sinusitis     Discharge Instructions     Treating you for a sinus infection  Take the antibiotics as prescribed OTC medicines as needed.  Follow up as needed for continued or worsening symptoms     ED Prescriptions    Medication Sig Dispense Auth. Provider   amoxicillin-clavulanate (AUGMENTIN) 875-125 MG tablet Take 1 tablet by mouth every 12 (twelve) hours. 14 tablet Phyllis Abelson A, NP     PDMP not reviewed this encounter.   Janace Aris, NP 03/15/20 [phone]

## 2020-03-14 NOTE — Discharge Instructions (Addendum)
Treating you for a sinus infection Take the antibiotics as prescribed OTC medicines as needed.  Follow up as needed for continued or worsening symptoms  

## 2020-03-16 LAB — SARS-COV-2, NAA 2 DAY TAT

## 2020-03-16 LAB — NOVEL CORONAVIRUS, NAA: SARS-CoV-2, NAA: NOT DETECTED

## 2021-05-14 ENCOUNTER — Encounter: Payer: Self-pay | Admitting: Emergency Medicine

## 2021-05-14 ENCOUNTER — Ambulatory Visit
Admission: EM | Admit: 2021-05-14 | Discharge: 2021-05-14 | Disposition: A | Discharge status: Home / Self Care | Payer: Managed Care, Other (non HMO)

## 2021-05-14 DIAGNOSIS — J019 Acute sinusitis, unspecified: Secondary | ICD-10-CM | POA: Diagnosis not present

## 2021-05-14 DIAGNOSIS — J209 Acute bronchitis, unspecified: Secondary | ICD-10-CM | POA: Diagnosis not present

## 2021-05-14 MED ORDER — PROMETHAZINE-DM 6.25-15 MG/5ML PO SYRP: 5.0000 mL | ORAL_SOLUTION | Freq: Three times a day (TID) | ORAL | 0 refills | Status: AC | PRN

## 2021-05-14 MED ORDER — ALBUTEROL SULFATE HFA 108 (90 BASE) MCG/ACT IN AERS
2.0000 | INHALATION_SPRAY | RESPIRATORY_TRACT | Status: AC
  Administered 2021-05-14: 2 via RESPIRATORY_TRACT

## 2021-05-14 MED ORDER — ALBUTEROL SULFATE HFA 108 (90 BASE) MCG/ACT IN AERS: 2.0000 | INHALATION_SPRAY | Freq: Four times a day (QID) | RESPIRATORY_TRACT | 0 refills | Status: AC | PRN

## 2021-05-14 MED ORDER — AMOXICILLIN-POT CLAVULANATE 875-125 MG PO TABS: 1.0000 | ORAL_TABLET | Freq: Two times a day (BID) | ORAL | 0 refills | Status: AC

## 2021-05-14 MED ORDER — PREDNISONE 20 MG PO TABS: 40.0000 mg | ORAL_TABLET | Freq: Every day | ORAL | 0 refills | Status: AC

## 2021-05-14 NOTE — ED Triage Notes (Signed)
Pt presents with cough, runny nose, watery eyes, and rib pain when he coughs x 2 weeks. ?

## 2021-05-14 NOTE — Discharge Instructions (Addendum)
Your vital signs are overall stable and suspect rib pain is related to cough. Recommend taking medication prescribed today and if symptoms have not improve or resolved following 5 days of treatment to return here for re-evaluation or follow-up with primary care provider. ?

## 2021-05-14 NOTE — ED Provider Notes (Signed)
?[facility] ? ? ? ?CSN: [id number] ?Arrival date & time: 05/14/21  1103 ? ? ?  ? ?History   ?Chief Complaint ?Chief Complaint  ?Patient presents with  ? Nasal Congestion  ? Cough  ? ? ?HPI ?Austin Perry. is a 61 y.o. male.  ? ?HPI ?Patient with a medical history significant for diabetes, hypertension, current daily smoker, presents today with a two week history of cough, runny nose, watery eyes, rib pain with coughing, and x 2 weeks. He has remained afebrile. Denies any known sick contacts.  ? ?Past Medical History:  ?Diagnosis Date  ? Diabetes mellitus without complication (HCC)   ? Hyperlipidemia   ? Hypertension   ? Kidney stone   ? ? ?There are no problems to display for this patient. ? ? ?Past Surgical History:  ?Procedure Laterality Date  ? COLONOSCOPY  2015  ? Dr . Bluford Kaufmann  ? HERNIA REPAIR Left 2012  ? inguinal hernia  ? KIDNEY STONE SURGERY    ? PILONIDAL CYST EXCISION  1989  ? ? ? ? ? ?Home Medications   ? ?Prior to Admission medications   ?Medication Sig Start Date End Date Taking? Authorizing Provider  ?albuterol (VENTOLIN HFA) 108 (90 Base) MCG/ACT inhaler Inhale 2 puffs into the lungs every 6 (six) hours as needed for wheezing or shortness of breath. 05/14/21  Yes Bing Neighbors, FNP  ?allopurinol (ZYLOPRIM) 300 MG tablet Take 300 mg by mouth daily. 01/15/20  Yes [provider]  ?amLODipine (NORVASC) 5 MG tablet Take 5 mg by mouth daily.  11/02/13  Yes [provider]  ?amoxicillin-clavulanate (AUGMENTIN) 875-125 MG tablet Take 1 tablet by mouth every 12 (twelve) hours for 10 days. 05/14/21 05/24/21 Yes Bing Neighbors, FNP  ?atorvastatin (LIPITOR) 20 MG tablet Take by mouth. 09/23/19 05/14/21 Yes [provider]  ?empagliflozin (JARDIANCE) 25 MG TABS tablet Take 1 tablet by mouth daily. 03/30/21 03/30/22 Yes [provider]  ?metFORMIN (GLUCOPHAGE) 500 MG tablet 500 mg daily with breakfast.  11/02/13  Yes [provider]  ?predniSONE (DELTASONE)  20 MG tablet Take 2 tablets (40 mg total) by mouth daily with breakfast for 3 days. 05/14/21 05/17/21 Yes Bing Neighbors, FNP  ?promethazine-dextromethorphan (PROMETHAZINE-DM) 6.25-15 MG/5ML syrup Take 5 mLs by mouth 3 (three) times daily as needed for cough. 05/14/21  Yes Bing Neighbors, FNP  ?benzonatate (TESSALON) 100 MG capsule Take 1 capsule (100 mg total) by mouth 3 (three) times daily as needed for cough. 08/20/19   Mickie Bail, NP  ?traMADol (ULTRAM) 50 MG tablet Take 50 mg by mouth every 12 (twelve) hours as needed.  01/20/14   [provider]  ?triamcinolone (NASACORT) 55 MCG/ACT AERO nasal inhaler Place into the nose.    [provider]  ?simvastatin (ZOCOR) 20 MG tablet Take 20 mg by mouth daily at 6 PM.  11/02/13 03/14/20  [provider]  ? ? ?Family History ?History reviewed. No pertinent family history. ? ?Social History ?Social History  ? ?Tobacco Use  ? Smoking status: Every Day  ?  Packs/day: 1.00  ?  Years: 3.00  ?  Pack years: 3.00  ?  Types: Cigarettes  ? Smokeless tobacco: Never  ?Vaping Use  ? Vaping Use: Never used  ?Substance Use Topics  ? Alcohol use: Yes  ?  Alcohol/week: 0.0 standard drinks  ?  Comment: occ  ? Drug use: No  ? ? ? ?Allergies   ?Other and Meloxicam ? ? ?  Review of Systems ?Review of Systems ?Pertinent negatives listed in HPI  ? ?Physical Exam ?Triage Vital Signs ?ED Triage Vitals [05/14/21 1126]  ?Enc Vitals Group  ?   BP (!) 151/86  ?   Pulse Rate 86  ?   Resp 18  ?   Temp 98.9 ?F (37.2 ?C)  ?   Temp Source Oral  ?   SpO2 94 %  ?   Weight   ?   Height   ?   Head Circumference   ?   Peak Flow   ?   Pain Score   ?   Pain Loc   ?   Pain Edu?   ?   Excl. in GC?   ? ?No data found. ? ?Updated Vital Signs ?BP (!) 151/86 (BP Location: Left Arm)   Pulse 86   Temp 98.9 ?F (37.2 ?C) (Oral)   Resp 18   SpO2 94%  ? ?Visual Acuity ?Right Eye Distance:   ?Left Eye Distance:   ?Bilateral Distance:   ? ?Right Eye Near:   ?Left Eye Near:    ?Bilateral  Near:    ? ?Physical Exam ?Constitutional:   ?   Appearance: Normal appearance.  ?HENT:  ?   Head: Normocephalic and atraumatic.  ?   Nose: Congestion and rhinorrhea present.  ?Eyes:  ?   Extraocular Movements: Extraocular movements intact.  ?   Conjunctiva/sclera: Conjunctivae normal.  ?   Pupils: Pupils are equal, round, and reactive to light.  ?Cardiovascular:  ?   Rate and Rhythm: Normal rate and regular rhythm.  ?Pulmonary:  ?   Effort: Pulmonary effort is normal.  ?   Breath sounds: Normal breath sounds.  ?Musculoskeletal:  ?   Cervical back: Normal range of motion and neck supple.  ?Lymphadenopathy:  ?   Cervical: Cervical adenopathy present.  ?Skin: ?   General: Skin is warm and dry.  ?   Capillary Refill: Capillary refill takes less than 2 seconds.  ?Neurological:  ?   General: No focal deficit present.  ?   Mental Status: He is alert.  ?Psychiatric:     ?   Mood and Affect: Mood normal.     ?   Behavior: Behavior normal.  ? ?UC Treatments / Results  ?Labs ?(all labs ordered are listed, but only abnormal results are displayed) ?Labs Reviewed - No data to display ? ?EKG ? ? ?Radiology ?No results found. ? ?Procedures ?Procedures (including critical care time) ? ?Medications Ordered in UC ?Medications  ?albuterol (VENTOLIN HFA) 108 (90 Base) MCG/ACT inhaler 2 puff (2 puffs Inhalation Given 05/14/21 1157)  ? ? ?Initial Impression / Assessment and Plan / UC Course  ?I have reviewed the triage vital signs and the nursing notes. ? ?Pertinent labs & imaging results that were available during my care of the patient were reviewed by me and considered in my medical decision making (see chart for details). ? ?  ?Acute Sinusitis and Acute Bronchitis  ?Treatment today with prednisone, Augmentin, promethazine-DM.  ?Albuterol 2 puffs PRN. Patient is a diabetic, however, given the severity of wheezing, opted for a 3 days course of prednisone. ?RTC PRN   ?Final Clinical Impressions(s) / UC Diagnoses  ? ?Final diagnoses:   ?Acute non-recurrent sinusitis, unspecified location  ?Acute bronchitis, unspecified organism  ? ? ? ?Discharge Instructions   ? ?  ?Your vital signs are overall stable and suspect rib pain is related to cough. Recommend taking medication prescribed today and if  symptoms have not improve or resolved following 5 days of treatment to return here for re-evaluation or follow-up with primary care provider. ? ? ? ? ?ED Prescriptions   ? ? Medication Sig Dispense Auth. Provider  ? amoxicillin-clavulanate (AUGMENTIN) 875-125 MG tablet Take 1 tablet by mouth every 12 (twelve) hours for 10 days. 20 tablet Bing Neighbors, FNP  ? promethazine-dextromethorphan (PROMETHAZINE-DM) 6.25-15 MG/5ML syrup Take 5 mLs by mouth 3 (three) times daily as needed for cough. 140 mL Bing Neighbors, FNP  ? predniSONE (DELTASONE) 20 MG tablet Take 2 tablets (40 mg total) by mouth daily with breakfast for 3 days. 6 tablet Bing Neighbors, FNP  ? albuterol (VENTOLIN HFA) 108 (90 Base) MCG/ACT inhaler Inhale 2 puffs into the lungs every 6 (six) hours as needed for wheezing or shortness of breath. 1 each Bing Neighbors, FNP  ? ?  ? ?PDMP not reviewed this encounter. ?  ?Bing Neighbors, FNP ?05/17/21 1315 ? ?

## 2021-08-08 IMAGING — CR DG CHEST 2V
1 series · 2 of 2 positions shown · non-contrast
Comparison: 02/21/2010

CLINICAL DATA: Productive cough

EXAM:
CHEST - 2 VIEW

[Series 1: dg chest 2 view · 0.14mm/px · 2 of 2 slices shown]
[im 1/2]
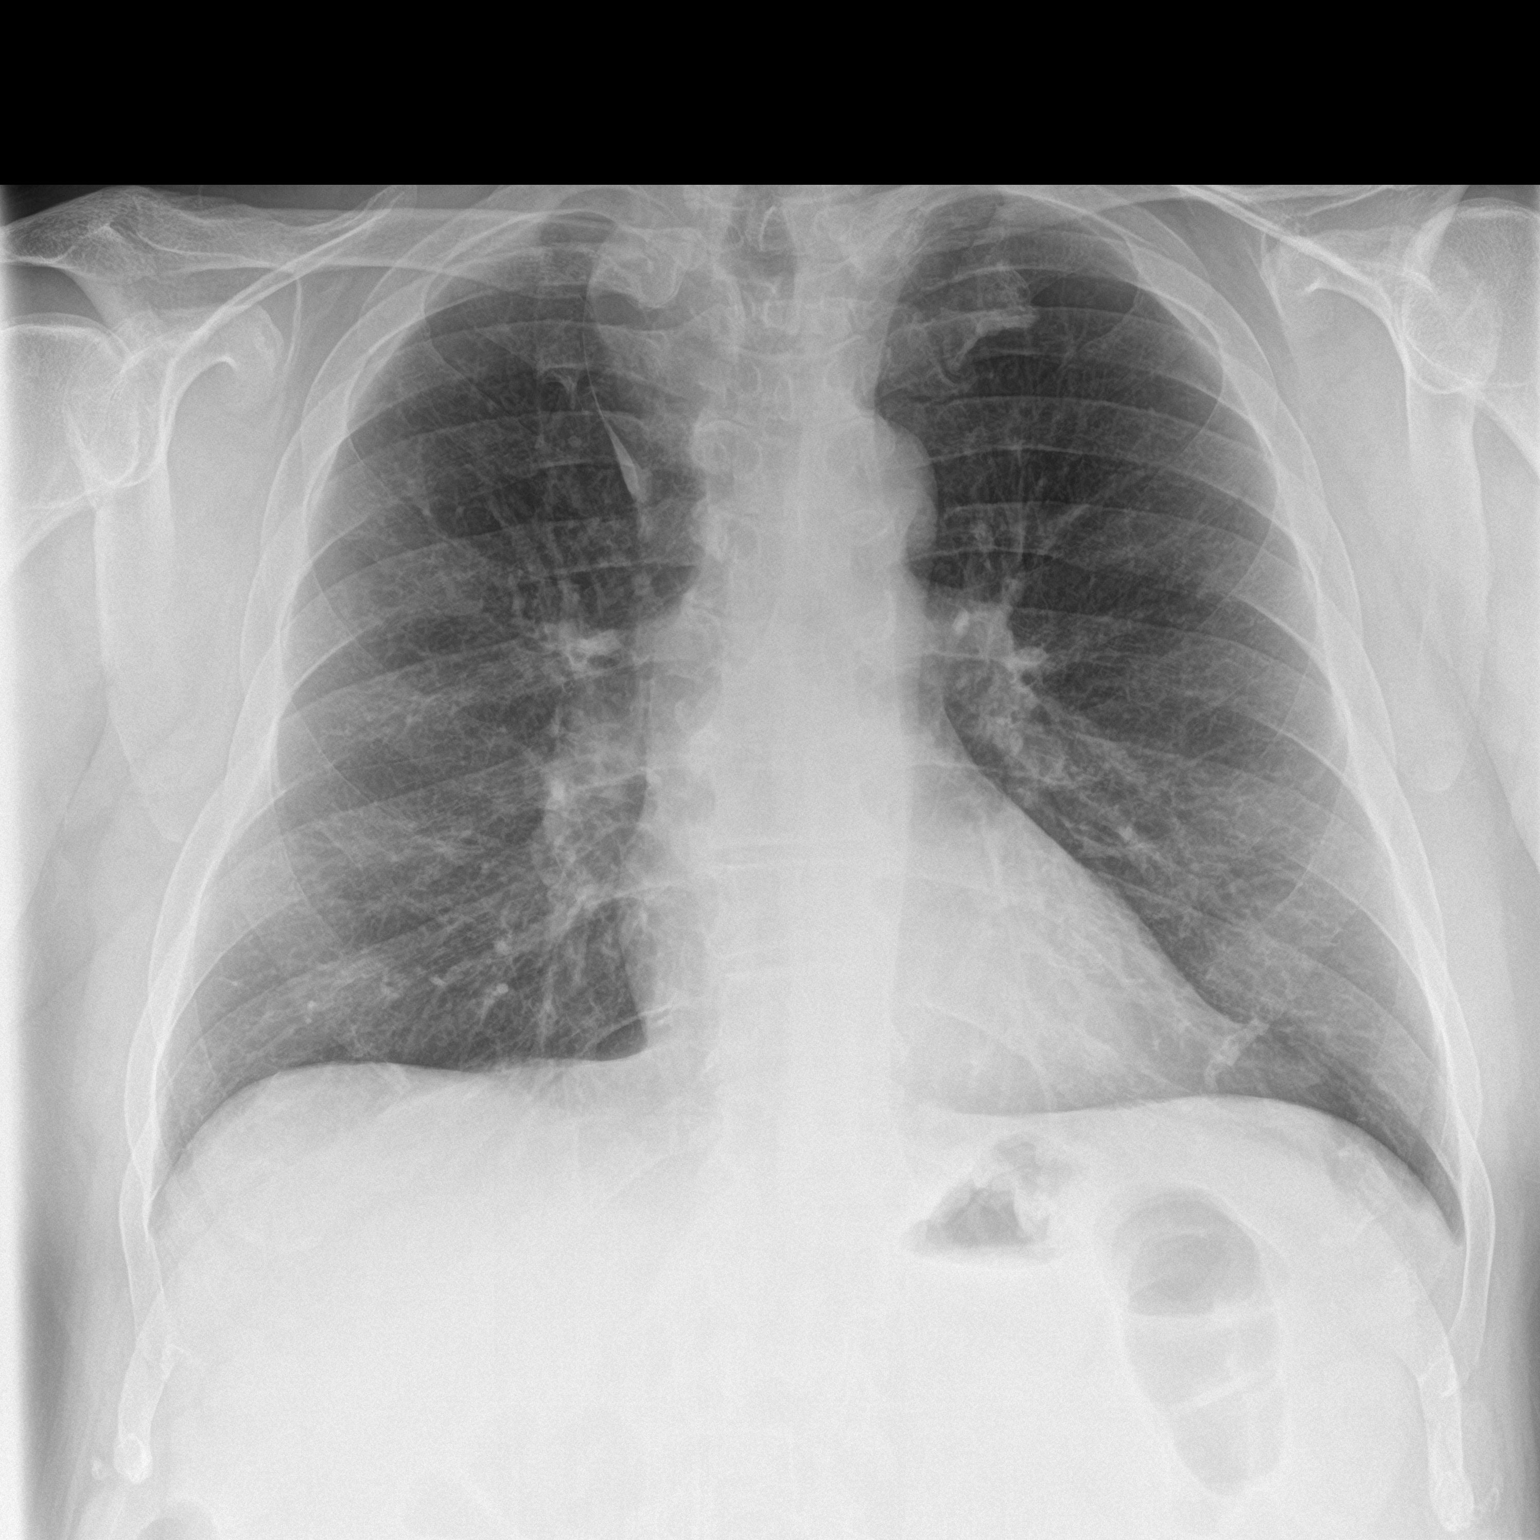
[im 2/2]
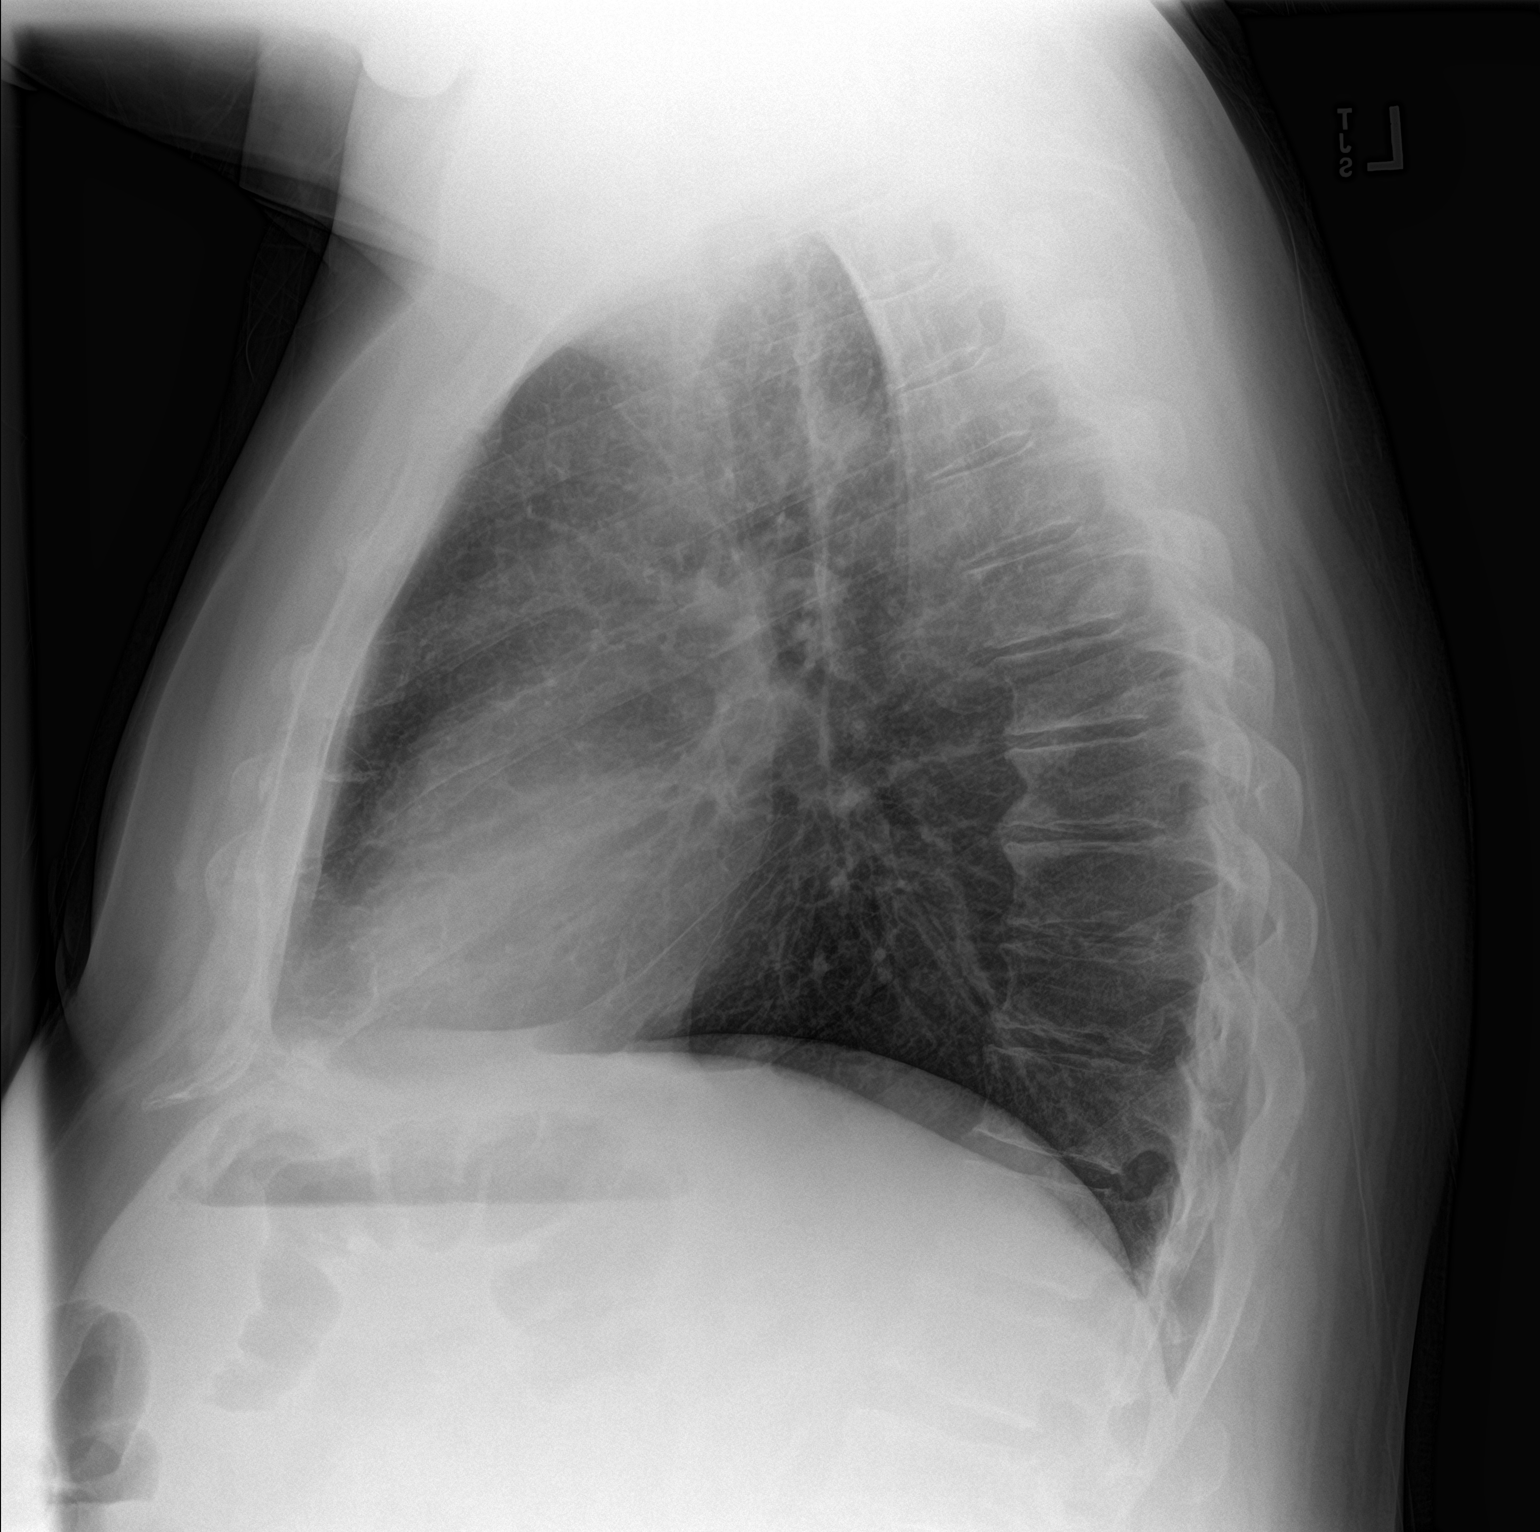

[2 of 2 positions shown; findings below may reference images not displayed]

FINDINGS: Mild peribronchial thickening, stable. No confluent opacities or
effusions. No acute bony abnormality.
IMPRESSION: Mild chronic bronchitic changes.

## 2021-10-13 ENCOUNTER — Ambulatory Visit
Admission: RE | Admit: 2021-10-13 | Discharge: 2021-10-13 | Disposition: A | Discharge status: Home / Self Care | Payer: Managed Care, Other (non HMO) | Source: Ambulatory Visit | Attending: Urgent Care | Admitting: Urgent Care

## 2021-10-13 ENCOUNTER — Telehealth: Payer: Self-pay | Admitting: Urgent Care

## 2021-10-13 VITALS — BP 152/84 | HR 64 | Temp 97.9°F | Resp 15

## 2021-10-13 DIAGNOSIS — Z1152 Encounter for screening for COVID-19: Secondary | ICD-10-CM | POA: Insufficient documentation

## 2021-10-13 DIAGNOSIS — J069 Acute upper respiratory infection, unspecified: Secondary | ICD-10-CM | POA: Insufficient documentation

## 2021-10-13 DIAGNOSIS — Z7984 Long term (current) use of oral hypoglycemic drugs: Secondary | ICD-10-CM | POA: Diagnosis not present

## 2021-10-13 DIAGNOSIS — R059 Cough, unspecified: Secondary | ICD-10-CM | POA: Diagnosis present

## 2021-10-13 DIAGNOSIS — F17210 Nicotine dependence, cigarettes, uncomplicated: Secondary | ICD-10-CM | POA: Diagnosis not present

## 2021-10-13 DIAGNOSIS — E119 Type 2 diabetes mellitus without complications: Secondary | ICD-10-CM | POA: Insufficient documentation

## 2021-10-13 LAB — RESP PANEL BY RT-PCR (RSV, FLU A&B, COVID)  RVPGX2
Influenza A by PCR: NEGATIVE
Influenza B by PCR: NEGATIVE
Resp Syncytial Virus by PCR: NEGATIVE
SARS Coronavirus 2 by RT PCR: NEGATIVE

## 2021-10-13 NOTE — Telephone Encounter (Signed)
COVID add-on post discharge

## 2021-10-13 NOTE — Discharge Instructions (Addendum)
Follow up here or with your primary care provider if your symptoms are worsening or not improving.     

## 2021-10-13 NOTE — ED Provider Notes (Signed)
[facility]    CSN: [id number] Arrival date & time: 10/13/21  1504      History   Chief Complaint Chief Complaint  Patient presents with   Cough    Sinus and Chest Congestion - Entered by patient   Nasal Congestion    HPI Austin Jani. is a 61 y.o. male.    Cough   Patient presents to urgent care with complaint of sinus pressure and nasal congestion for 3 to 4 days.  He also endorses "chest congestion."  Cough is present and productive of colored sputum.  Denies fever.  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 Candace Cruise   HERNIA REPAIR Left 2012   inguinal hernia   [place] Medications    Prior to Admission medications   Medication Sig Start Date End Date Taking? Authorizing Provider  amLODipine (NORVASC) 5 MG tablet Take 1 tablet by mouth daily. 12/25/16  Yes [provider]  atorvastatin (LIPITOR) 20 MG tablet TAKE 1 TABLET(20 MG) BY MOUTH EVERY DAY 08/18/21  Yes [provider]  metFORMIN (GLUCOPHAGE) 500 MG tablet Take by mouth. 11/02/13  Yes [provider]  sildenafil (REVATIO) 20 MG tablet 3 PO daily as needed.  09/24/17  Yes [provider]  simvastatin (ZOCOR) 20 MG tablet  03/10/17  Yes [provider]  telmisartan (MICARDIS) 20 MG tablet Take by mouth. 03/30/21 03/30/22 Yes [provider]  albuterol (VENTOLIN HFA) 108 (90 Base) MCG/ACT inhaler Inhale 2 puffs into the lungs every 6 (six) hours as needed for wheezing or shortness of breath. 05/14/21   Scot Jun, FNP  allopurinol (ZYLOPRIM) 300 MG tablet Take 300 mg by mouth daily. 01/15/20   [provider]  amLODipine (NORVASC) 5 MG tablet Take 5 mg by mouth daily.  11/02/13   [provider]  atorvastatin (LIPITOR) 20 MG tablet Take by mouth. 09/23/19 05/14/21  [provider]  benzonatate (TESSALON) 100 MG capsule Take 1 capsule (100 mg total) by mouth 3 (three) times daily as needed for cough. 08/20/19   Sharion Balloon, NP  empagliflozin (JARDIANCE) 25 MG TABS tablet Take 1 tablet by mouth daily. 03/30/21 03/30/22  [provider]  metFORMIN (GLUCOPHAGE) 500 MG tablet 500 mg daily with breakfast.  11/02/13   [provider]  promethazine-dextromethorphan (PROMETHAZINE-DM) 6.25-15 MG/5ML syrup Take 5 mLs by mouth 3 (three) times daily as needed for cough. 05/14/21   Scot Jun, FNP  traMADol (ULTRAM) 50 MG tablet Take 50 mg by mouth every 12 (twelve) hours as needed.  01/20/14   [provider]  triamcinolone (NASACORT) 55 MCG/ACT AERO nasal inhaler Place into the nose.    [provider]    Family History History reviewed. No pertinent family history.  Social History Social History   Tobacco Use   Smoking status: Every Day    Packs/day: 1.00    Years: 3.00    Total pack years: 3.00    Types: Cigarettes   Smokeless tobacco: Never  Vaping Use   Vaping Use: Never used  Substance Use Topics   Alcohol use: Yes    Alcohol/week: 0.0 standard drinks of alcohol    Comment: occ   Drug use: No     Allergies   Other and Meloxicam   Review of  Systems Review of Systems  Respiratory:  Positive for cough.      Physical Exam Triage Vital Signs ED Triage Vitals  Enc Vitals Group     BP 10/13/21 1516 (!) 152/84     Pulse Rate 10/13/21 1516 64     Resp 10/13/21 1516 15     Temp 10/13/21 1516 97.9 F (36.6 C)     Temp src --      SpO2 10/13/21 1516 97 %     Weight --      Height --      Head Circumference --      Peak Flow --      Pain Score 10/13/21 1518 0     Pain Loc --      Pain Edu? --      Excl. in GC? --    No data found.  Updated Vital Signs BP (!) 152/84   Pulse 64   Temp 97.9 F (36.6 C)   Resp 15   SpO2 97%   Visual Acuity Right Eye Distance:   Left Eye Distance:   Bilateral Distance:    Right Eye Near:   Left Eye Near:    Bilateral Near:     Physical Exam Constitutional:      Appearance: Normal appearance.  Skin:    General: Skin is warm and dry.  Neurological:     General: No focal deficit present.     Mental Status: He is alert and oriented to person, place, and time.  Psychiatric:        Mood and Affect: Mood normal.        Behavior: Behavior normal.      UC Treatments / Results  Labs (all labs ordered are listed, but only abnormal results are displayed) Labs Reviewed - No data to display  EKG   Radiology No results found.  Procedures Procedures (including critical care time)  Medications Ordered in UC Medications - No data to display  Initial Impression / Assessment and Plan / UC Course  I have reviewed the triage vital signs and the nursing notes.  Pertinent labs & imaging results that were available during my care of the patient were reviewed by me and considered in my medical decision making (see chart for details).   Suspect viral etiology given the duration of his symptoms.  Respiratory swab was obtained and pending.  Corticosteroid to reduce sinus inflammation is contraindicated given his comorbidity of diabetes.  He states he is already using Nasacort on a  daily basis for allergies.  Recommended continued use of OTC medication for symptom control as needed.  Discussed antibiotic stewardship given the likely viral etiology.   Final Clinical Impressions(s) / UC Diagnoses   Final diagnoses:  None   Discharge Instructions   None    ED Prescriptions   None    PDMP not reviewed this encounter.   Charma Igo, [place] 10/13/21 1529

## 2021-10-13 NOTE — ED Triage Notes (Signed)
Pt. Is c/o sinus pressure and congestions as well as chest congestion for the past 3-4 days. Pt. Has been treating himself w/ OTC medication w/ no relief.

## 2022-08-24 ENCOUNTER — Ambulatory Visit (INDEPENDENT_AMBULATORY_CARE_PROVIDER_SITE_OTHER): Payer: Managed Care, Other (non HMO)

## 2022-08-24 ENCOUNTER — Encounter: Payer: Self-pay | Admitting: Podiatry

## 2022-08-24 ENCOUNTER — Ambulatory Visit: Payer: Managed Care, Other (non HMO) | Admitting: Podiatry

## 2022-08-24 DIAGNOSIS — M779 Enthesopathy, unspecified: Secondary | ICD-10-CM

## 2022-08-24 DIAGNOSIS — M722 Plantar fascial fibromatosis: Secondary | ICD-10-CM | POA: Diagnosis not present

## 2022-08-24 DIAGNOSIS — E0843 Diabetes mellitus due to underlying condition with diabetic autonomic (poly)neuropathy: Secondary | ICD-10-CM

## 2022-08-24 MED ORDER — GABAPENTIN 100 MG PO CAPS: 100.0000 mg | ORAL_CAPSULE | Freq: Three times a day (TID) | ORAL | 3 refills | Status: DC

## 2022-08-24 NOTE — Progress Notes (Signed)
   Chief Complaint  Patient presents with   Foot Pain    "My feet hurt all the time.  I'm Diabetic.  They burn and sting." N - my feet hurt L - bilateral ball of foot and heel D - 1.5 yrs O - gradually worse C - burn, sting, Diabetic, ache A - concrete all day, walking T - Ibuprofen or Aleve, tried different shoes    HPI: 62 y.o. male PMHx T2DM, last A1c was 6.5 on 07/09/2022, presenting today for burning and stinging sensation to the bilateral feet.  Gradual onset.  Denies a history of injury.  Onset over 1 year ago.  Patient works on his feet all day.  He has tried different shoes, arch supports, anti-inflammatories without any significant relief  Past Medical History:  Diagnosis Date   Diabetes mellitus without complication (HCC)    Hyperlipidemia    Hypertension    Kidney stone     Past Surgical History:  Procedure Laterality Date   COLONOSCOPY  2015   Dr . Bluford Kaufmann   HERNIA REPAIR Left 2012   inguinal hernia   KIDNEY STONE SURGERY     PILONIDAL CYST EXCISION  1989    Allergies  Allergen Reactions   Other Swelling    Yellow jackets caused Facial swelling    Meloxicam Nausea Only    Nausea and abdominal pain     Physical Exam: General: The patient is alert and oriented x3 in no acute distress.  Dermatology: Skin is warm, dry and supple bilateral lower extremities.   Vascular: Palpable pedal pulses bilaterally. Capillary refill within normal limits.  No appreciable edema.  No erythema.  Neurological: Generalized paresthesia noted throughout the foot bilateral.  Light touch and protective threshold intact  Musculoskeletal Exam: No pedal deformities noted.  Generalized pain throughout palpation of the foot bilateral  Radiographic Exam B/L feet 08/24/2022:  Normal osseous mineralization. Joint spaces preserved.  No fractures or osseous irregularities noted.  Impression: Negative  Assessment/Plan of Care: 1.  Diabetes mellitus; controlled with peripheral  polyneuropathy 2.  Generalized foot pain bilateral  -Patient evaluated.  X-rays reviewed -I do believe the patient's majority of his symptoms is due to his work being on his feet all day long on concrete floors.  We did discuss custom molded orthotics to help support the medial longitudinal arch of the foot and potentially alleviate pain throughout the foot throughout the day.  Patient agrees and would like to pursue this conservative option -Today the patient was molded for custom orthotics -Prescription for gabapentin 100 mg 3 times daily -Return to clinic for orthotics pickup, as needed medically      Felecia Shelling, DPM [facility]  Dr. Felecia Shelling, DPM    [address] [place], [place] [address]                Office [phone]  Fax [phone]

## 2022-09-04 DIAGNOSIS — M722 Plantar fascial fibromatosis: Secondary | ICD-10-CM | POA: Diagnosis not present

## 2022-09-04 DIAGNOSIS — E0843 Diabetes mellitus due to underlying condition with diabetic autonomic (poly)neuropathy: Secondary | ICD-10-CM | POA: Diagnosis not present

## 2022-09-10 ENCOUNTER — Ambulatory Visit: Payer: Managed Care, Other (non HMO)

## 2022-09-10 NOTE — Progress Notes (Signed)
Patient presents today to pick up custom molded foot orthotics, diagnosed with Plantar Fasciitis by Dr. Logan Bores.   Orthotics were dispensed and fit was satisfactory. Reviewed instructions for break-in and wear. Written instructions given to patient.  Patient will follow up as needed.   Austin Perry CPed, CFo, CFm

## 2022-09-11 ENCOUNTER — Encounter (HOSPITAL_BASED_OUTPATIENT_CLINIC_OR_DEPARTMENT_OTHER): Payer: Self-pay | Admitting: Emergency Medicine

## 2022-09-11 ENCOUNTER — Other Ambulatory Visit: Payer: Self-pay

## 2022-09-11 ENCOUNTER — Other Ambulatory Visit (HOSPITAL_BASED_OUTPATIENT_CLINIC_OR_DEPARTMENT_OTHER): Payer: Self-pay

## 2022-09-11 ENCOUNTER — Emergency Department (HOSPITAL_BASED_OUTPATIENT_CLINIC_OR_DEPARTMENT_OTHER)
Admission: EM | Admit: 2022-09-11 | Discharge: 2022-09-11 | Disposition: A | Discharge status: Home / Self Care | Payer: Managed Care, Other (non HMO) | Attending: Emergency Medicine | Admitting: Emergency Medicine

## 2022-09-11 DIAGNOSIS — Y990 Civilian activity done for income or pay: Secondary | ICD-10-CM | POA: Diagnosis not present

## 2022-09-11 DIAGNOSIS — W010XXA Fall on same level from slipping, tripping and stumbling without subsequent striking against object, initial encounter: Secondary | ICD-10-CM | POA: Diagnosis not present

## 2022-09-11 DIAGNOSIS — S7012XA Contusion of left thigh, initial encounter: Secondary | ICD-10-CM | POA: Insufficient documentation

## 2022-09-11 DIAGNOSIS — Z7984 Long term (current) use of oral hypoglycemic drugs: Secondary | ICD-10-CM | POA: Insufficient documentation

## 2022-09-11 DIAGNOSIS — Z79899 Other long term (current) drug therapy: Secondary | ICD-10-CM | POA: Diagnosis not present

## 2022-09-11 DIAGNOSIS — S76302A Unspecified injury of muscle, fascia and tendon of the posterior muscle group at thigh level, left thigh, initial encounter: Secondary | ICD-10-CM

## 2022-09-11 DIAGNOSIS — S79922A Unspecified injury of left thigh, initial encounter: Secondary | ICD-10-CM | POA: Diagnosis present

## 2022-09-11 MED ORDER — OXYCODONE-ACETAMINOPHEN 5-325 MG PO TABS
1.0000 | ORAL_TABLET | Freq: Once | ORAL | Status: AC
  Administered 2022-09-11: 1 via ORAL
  Filled 2022-09-11: qty 1

## 2022-09-11 MED ORDER — CELECOXIB 200 MG PO CAPS: 200.0000 mg | ORAL_CAPSULE | Freq: Two times a day (BID) | ORAL | 0 refills | Status: AC

## 2022-09-11 MED ORDER — OXYCODONE-ACETAMINOPHEN 5-325 MG PO TABS
1.0000 | ORAL_TABLET | ORAL | Status: DC | PRN
  Administered 2022-09-11: 1 via ORAL
  Filled 2022-09-11: qty 1

## 2022-09-11 MED ORDER — OXYCODONE HCL 5 MG PO TABS: 2.5000 mg | ORAL_TABLET | Freq: Four times a day (QID) | ORAL | 0 refills | Status: AC | PRN

## 2022-09-11 NOTE — ED Notes (Signed)
DC papers reviewed. No questions or concerns. No signs of distress. Pt assisted to wheelchair and out to lobby. Appropriate measures for safety taken. 

## 2022-09-11 NOTE — ED Provider Notes (Signed)
[place] [facility]MENT AT [facility] Provider Note   CSN: [id number] Arrival date & time: 09/11/22  1119     History  Chief Complaint  Patient presents with   Leg Pain    Austin Perry. is a 62 y.o. male.  Who presents emergency department with complaint of left leg pain.  Patient injured his leg at work today when he got his foot caught up in a [place] [address] [place].  He had immediate severe pain in the left posterior thigh.  Since that time he has severe pain when he tries to straighten his left leg and feels better at rest.  He is rates his pain at 10 out of 10 despite getting some Percocet earlier.  He denies numbness or tingling.  He states "I am sure this is a hamstring injury I had 1 before when I used to play football."   Leg Pain      Home Medications Prior to Admission medications   Medication Sig Start Date End Date Taking? Authorizing Provider  albuterol (VENTOLIN HFA) 108 (90 Base) MCG/ACT inhaler Inhale 2 puffs into the lungs every 6 (six) hours as needed for wheezing or shortness of breath. 05/14/21   Bing Neighbors, NP  allopurinol (ZYLOPRIM) 300 MG tablet Take 300 mg by mouth daily. 01/15/20   [provider]  amLODipine (NORVASC) 5 MG tablet Take 5 mg by mouth daily.  11/02/13   [provider]  amLODipine (NORVASC) 5 MG tablet Take 1 tablet by mouth daily. 12/25/16   [provider]  atorvastatin (LIPITOR) 20 MG tablet Take by mouth. 09/23/19 05/14/21  [provider]  atorvastatin (LIPITOR) 20 MG tablet TAKE 1 TABLET(20 MG) BY MOUTH EVERY DAY 08/18/21   [provider]  benzonatate (TESSALON) 100 MG capsule Take 1 capsule (100 mg total) by mouth 3 (three) times daily as needed for cough. 08/20/19   Mickie Bail, NP  gabapentin (NEURONTIN) 100 MG capsule Take 1 capsule (100 mg total) by mouth 3 (three) times daily. 08/24/22   Felecia Shelling, DPM  metFORMIN (GLUCOPHAGE) 500 MG tablet 500 mg daily with breakfast.   11/02/13   [provider]  metFORMIN (GLUCOPHAGE) 500 MG tablet Take by mouth. 11/02/13   [provider]  promethazine-dextromethorphan (PROMETHAZINE-DM) 6.25-15 MG/5ML syrup Take 5 mLs by mouth 3 (three) times daily as needed for cough. 05/14/21   Bing Neighbors, NP  sildenafil (REVATIO) 20 MG tablet 3 PO daily as needed. 09/24/17   [provider]  simvastatin (ZOCOR) 20 MG tablet  03/10/17   [provider]  telmisartan (MICARDIS) 20 MG tablet Take by mouth. 03/30/21 03/30/22  [provider]  traMADol (ULTRAM) 50 MG tablet Take 50 mg by mouth every 12 (twelve) hours as needed.  01/20/14   [provider]  triamcinolone (NASACORT) 55 MCG/ACT AERO nasal inhaler Place into the nose.    [provider]      Allergies    Other and Meloxicam    Review of Systems   Review of Systems  Physical Exam Updated Vital Signs BP (!) 145/81 (BP Location: Right Arm)   Pulse 98   Temp 98.3 F (36.8 C) (Oral)   Resp 18   Ht 5\' 10"  (1.778 m)   Wt 105.2 kg   SpO2 99%   BMI 33.29 kg/m  Physical Exam Physical Exam  Nursing note and vitals reviewed. Constitutional: He appears well-developed and well-nourished. No distress.  HENT:  Head: Normocephalic  and atraumatic.  Eyes: Conjunctivae normal are normal. No scleral icterus.  Neck: Normal range of motion. Neck supple.  Cardiovascular: Normal rate, regular rhythm and normal heart sounds.   Pulmonary/Chest: Effort normal and breath sounds normal. No respiratory distress.  Abdominal: Soft. There is no tenderness.  Musculoskeletal: Posterior left thigh examination reveals mild bruising at the distal medial, medial, posterior left thigh.  Tenderness along the medial part of the posterior thigh.  Able to flex the knee easily, unable to fully straighten due to pain in the posterior thigh, compartments are soft.  DP PT and popliteal pulses intact. Neurological: He is alert.  Skin: Skin is warm  and dry. He is not diaphoretic.  Psychiatric: His behavior is normal.   ED Results / Procedures / Treatments   Labs (all labs ordered are listed, but only abnormal results are displayed) Labs Reviewed - No data to display  EKG None  Radiology No results found.  Procedures Procedures    Medications Ordered in ED Medications  oxyCODONE-acetaminophen (PERCOCET/ROXICET) 5-325 MG per tablet 1 tablet (1 tablet Oral Given 09/11/22 1130)    ED Course/ Medical Decision Making/ A&P                                 Medical Decision Making Risk Prescription drug management.   Patient here with left hamstring injury.  Suspect torn hamstring.  Mechanisms of movement of the knee are intact as well as posterior hip.  Patient placed in knee immobilizer, crutches.  Since he is in severe pain I have given him a minimal supply of oxycodone as well as Celebrex.  Outpatient follow-up with orthopedics, neurovascular intact without signs of compartment syndrome.  Discussed return precautions.       Final Clinical Impression(s) / ED Diagnoses Final diagnoses:  None    Rx / DC Orders ED Discharge Orders     None         Arthor Captain, PA-C 09/11/22 1401    Sloan Leiter, DO 09/12/22 2010

## 2022-09-11 NOTE — ED Triage Notes (Signed)
Pt arrives to ED with c/o left leg pain that started today after pt tripped and fell over a hose at work. Pt notes pain is in hamstring.

## 2022-10-30 ENCOUNTER — Other Ambulatory Visit: Payer: Self-pay

## 2022-10-30 MED ORDER — GABAPENTIN 100 MG PO CAPS: 100.0000 mg | ORAL_CAPSULE | Freq: Three times a day (TID) | ORAL | 3 refills | Status: AC
